# Patient Record
Sex: Female | Born: 2000 | Hispanic: Yes | Smoking: Never smoker
Health system: Southern US, Community
[De-identification: ages and names within clinical notes are randomized; demographics above are authoritative.]

## PROBLEM LIST (undated history)

## (undated) ENCOUNTER — Emergency Department (HOSPITAL_COMMUNITY): Admission: EM | Payer: Medicaid Other | Source: Home / Self Care

## (undated) DIAGNOSIS — G4700 Insomnia, unspecified: Secondary | ICD-10-CM

## (undated) DIAGNOSIS — J45909 Unspecified asthma, uncomplicated: Secondary | ICD-10-CM

## (undated) HISTORY — PX: NO PAST SURGERIES: SHX2092

---
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
## 2022-01-09 LAB — OB RESULTS CONSOLE GBS: GBS: NEGATIVE

## 2022-01-21 ENCOUNTER — Inpatient Hospital Stay (HOSPITAL_COMMUNITY)
Admission: AD | Admit: 2022-01-21 | Discharge: 2022-01-24 | DRG: 807 | Disposition: A | Discharge status: Home / Self Care | Payer: Medicaid Other | Attending: Obstetrics and Gynecology | Admitting: Obstetrics and Gynecology

## 2022-01-21 ENCOUNTER — Encounter (HOSPITAL_COMMUNITY): Payer: Self-pay | Admitting: Obstetrics and Gynecology

## 2022-01-21 DIAGNOSIS — K7689 Other specified diseases of liver: Secondary | ICD-10-CM | POA: Diagnosis present

## 2022-01-21 DIAGNOSIS — K831 Obstruction of bile duct: Secondary | ICD-10-CM | POA: Diagnosis present

## 2022-01-21 DIAGNOSIS — O26643 Intrahepatic cholestasis of pregnancy, third trimester: Principal | ICD-10-CM | POA: Diagnosis present

## 2022-01-21 DIAGNOSIS — E7879 Other disorders of bile acid and cholesterol metabolism: Secondary | ICD-10-CM | POA: Diagnosis present

## 2022-01-21 DIAGNOSIS — Z3490 Encounter for supervision of normal pregnancy, unspecified, unspecified trimester: Principal | ICD-10-CM | POA: Diagnosis present

## 2022-01-21 DIAGNOSIS — Z3A39 39 weeks gestation of pregnancy: Secondary | ICD-10-CM

## 2022-01-21 LAB — COMPREHENSIVE METABOLIC PANEL
ALT: 24 U/L (ref 0–44)
AST: 26 U/L (ref 15–41)
Albumin: 2.8 g/dL — ABNORMAL LOW (ref 3.5–5.0)
Alkaline Phosphatase: 275 U/L — ABNORMAL HIGH (ref 38–126)
Anion gap: 9 (ref 5–15)
BUN: 10 mg/dL (ref 6–20)
CO2: 19 mmol/L — ABNORMAL LOW (ref 22–32)
Calcium: 8.7 mg/dL — ABNORMAL LOW (ref 8.9–10.3)
Chloride: 108 mmol/L (ref 98–111)
Creatinine, Ser: 0.9 mg/dL (ref 0.44–1.00)
GFR, Estimated: >60 mL/min (ref >60)
Glucose, Bld: 84 mg/dL (ref 70–99)
Potassium: 4.1 mmol/L (ref 3.5–5.1)
Sodium: 136 mmol/L (ref 135–145)
Total Bilirubin: 0.2 mg/dL — ABNORMAL LOW (ref 0.3–1.2)
Total Protein: 6.6 g/dL (ref 6.5–8.1)

## 2022-01-21 LAB — TYPE AND SCREEN
ABO/RH(D): O POS
Antibody Screen: NEGATIVE

## 2022-01-21 LAB — CBC
HCT: 35.9 % — ABNORMAL LOW (ref 36.0–46.0)
Hemoglobin: 11.7 g/dL — ABNORMAL LOW (ref 12.0–15.0)
MCH: 25.5 pg — ABNORMAL LOW (ref 26.0–34.0)
MCHC: 32.6 g/dL (ref 30.0–36.0)
MCV: 78.4 fL — ABNORMAL LOW (ref 80.0–100.0)
Platelets: 244 10*3/uL (ref 150–400)
RBC: 4.58 MIL/uL (ref 3.87–5.11)
RDW: 15.4 % (ref 11.5–15.5)
WBC: 8.5 10*3/uL (ref 4.0–10.5)
nRBC: 0 % (ref 0.0–0.2)

## 2022-01-21 MED ORDER — FENTANYL CITRATE (PF) 100 MCG/2ML IJ SOLN
50.0000 ug | INTRAMUSCULAR | Status: DC | PRN
  Administered 2022-01-21 – 2022-01-22 (×2): 50 ug via INTRAVENOUS
  Filled 2022-01-21: qty 2

## 2022-01-21 MED ORDER — MISOPROSTOL 25 MCG QUARTER TABLET
25.0000 ug | ORAL_TABLET | ORAL | Status: DC
  Administered 2022-01-21 (×2): 25 ug via VAGINAL
  Filled 2022-01-21 (×2): qty 1

## 2022-01-21 MED ORDER — OXYTOCIN BOLUS FROM INFUSION
333.0000 mL | Freq: Once | INTRAVENOUS | Status: AC
  Administered 2022-01-22: 333 mL via INTRAVENOUS

## 2022-01-21 MED ORDER — OXYTOCIN-SODIUM CHLORIDE 30-0.9 UT/500ML-% IV SOLN
2.5000 [IU]/h | INTRAVENOUS | Status: DC
  Filled 2022-01-21: qty 500

## 2022-01-21 MED ORDER — SOD CITRATE-CITRIC ACID 500-334 MG/5ML PO SOLN: 30.0000 mL | ORAL | Status: DC | PRN

## 2022-01-21 MED ORDER — OXYCODONE-ACETAMINOPHEN 5-325 MG PO TABS: 2.0000 | ORAL_TABLET | ORAL | Status: DC | PRN

## 2022-01-21 MED ORDER — LIDOCAINE HCL (PF) 1 % IJ SOLN
30.0000 mL | INTRAMUSCULAR | Status: AC | PRN
  Administered 2022-01-22: 30 mL via SUBCUTANEOUS
  Filled 2022-01-21: qty 30

## 2022-01-21 MED ORDER — OXYCODONE-ACETAMINOPHEN 5-325 MG PO TABS: 1.0000 | ORAL_TABLET | ORAL | Status: DC | PRN

## 2022-01-21 MED ORDER — TERBUTALINE SULFATE 1 MG/ML IJ SOLN
0.2500 mg | Freq: Once | INTRAMUSCULAR | Status: AC | PRN
  Administered 2022-01-22: 0.25 mg via SUBCUTANEOUS

## 2022-01-21 MED ORDER — TERBUTALINE SULFATE 1 MG/ML IJ SOLN
0.2500 mg | Freq: Once | INTRAMUSCULAR | Status: DC | PRN
  Filled 2022-01-21: qty 1

## 2022-01-21 MED ORDER — ONDANSETRON HCL 4 MG/2ML IJ SOLN
4.0000 mg | Freq: Four times a day (QID) | INTRAMUSCULAR | Status: DC | PRN
  Administered 2022-01-22: 4 mg via INTRAVENOUS
  Filled 2022-01-21: qty 2

## 2022-01-21 MED ORDER — OXYTOCIN-SODIUM CHLORIDE 30-0.9 UT/500ML-% IV SOLN: 1.0000 m[IU]/min | INTRAVENOUS | Status: DC

## 2022-01-21 MED ORDER — LACTATED RINGERS IV SOLN: INTRAVENOUS | Status: DC

## 2022-01-21 MED ORDER — LACTATED RINGERS IV SOLN
500.0000 mL | INTRAVENOUS | Status: DC | PRN
  Administered 2022-01-21: 500 mL via INTRAVENOUS

## 2022-01-21 MED ORDER — ACETAMINOPHEN 325 MG PO TABS: 650.0000 mg | ORAL_TABLET | ORAL | Status: DC | PRN

## 2022-01-21 NOTE — H&P (Signed)
Alejandra Phillips is a 21 y.o. female presenting for IOL for cholestasis.  21 yo G1P0 @ 38+6 presents for IOL for cholestasis. The patient was seen in the office for her routine prenatal visit yesterday and c/o systemic itching. Bile acids were drawn and returned mildly elevated at 11. The decision was made to proceed with IOL for cholestasis. Her pregnancy has been otherwise uncomplicated except for teen pregnancy OB History     Gravida  1   Para      Term      Preterm      AB      Living         SAB      IAB      Ectopic      Multiple      Live Births             Past Medical History:  Diagnosis Date   Asthma    only has rescue inhaler-   Insomnia 07/21/2015   Past Surgical History:  Procedure Laterality Date   NO PAST SURGERIES     Family History: family history is not on file. Social History:  reports that she has never smoked. She has never used smokeless tobacco. She reports that she does not drink alcohol and does not use drugs.     Maternal Diabetes: No Genetic Screening: Normal Maternal Ultrasounds/Referrals: Normal Fetal Ultrasounds or other Referrals:  None Maternal Substance Abuse:  No Significant Maternal Medications:  None Significant Maternal Lab Results:  Group B Strep negative Number of Prenatal Visits:greater than 3 verified prenatal visits Other Comments:  None  Review of Systems History Dilation: 2 Effacement (%): 70 Station: -3 Exam by:: Cinda Quest, RN Blood pressure 126/87, pulse 64, temperature 98.5 F (36.9 C), temperature source Oral, resp. rate 17, height 5\' 4"  (1.626 m), weight 99.3 kg, last menstrual period 04/24/2021. Exam Physical Exam   AOx3, NAD Abd soft, NT FHR 150 reactive, cat 1 tracing Cvx 2/70/-3 Toco irreg  Prenatal labs: ABO, Rh: --/--/O POS (10/02 1500) Antibody: NEG (10/02 1500) Rubella: Immune (03/09 0000) RPR: Nonreactive (03/09 0000)  HBsAg: Negative (03/09 0000)  HIV: Non-reactive  (03/09 0000)  GBS: Negative/-- (09/20 0000)   Results for orders placed or performed during the hospital encounter of 01/21/22 (from the past 24 hour(s))  CBC     Status: Abnormal   Collection Time: 01/21/22  3:00 PM  Result Value Ref Range   WBC 8.5 4.0 - 10.5 K/uL   RBC 4.58 3.87 - 5.11 MIL/uL   Hemoglobin 11.7 (L) 12.0 - 15.0 g/dL   HCT 03/23/22 (L) 59.5 - 63.8 %   MCV 78.4 (L) 80.0 - 100.0 fL   MCH 25.5 (L) 26.0 - 34.0 pg   MCHC 32.6 30.0 - 36.0 g/dL   RDW 75.6 43.3 - 29.5 %   Platelets 244 150 - 400 K/uL   nRBC 0.0 0.0 - 0.2 %  Type and screen [facility]     Status: None   Collection Time: 01/21/22  3:00 PM  Result Value Ref Range   ABO/RH(D) O POS    Antibody Screen NEG    Sample Expiration      01/24/2022,2359 Performed at [facility], [address], [place], [place]   Comprehensive metabolic panel     Status: Abnormal   Collection Time: 01/21/22  3:00 PM  Result Value Ref Range   Sodium 136 135 - 145 mmol/L  Potassium 4.1 3.5 - 5.1 mmol/L   Chloride 108 98 - 111 mmol/L   CO2 19 (L) 22 - 32 mmol/L   Glucose, Bld 84 70 - 99 mg/dL   BUN 10 6 - 20 mg/dL   Creatinine, Ser 0.90 0.44 - 1.00 mg/dL   Calcium 8.7 (L) 8.9 - 10.3 mg/dL   Total Protein 6.6 6.5 - 8.1 g/dL   Albumin 2.8 (L) 3.5 - 5.0 g/dL   AST 26 15 - 41 U/L   ALT 24 0 - 44 U/L   Alkaline Phosphatase 275 (H) 38 - 126 U/L   Total Bilirubin 0.2 (L) 0.3 - 1.2 mg/dL   GFR, Estimated >60 >60 mL/min   Anion gap 9 5 - 15     Assessment/Plan: 1) Admit 2) Epidural on request 3) cytotec 110mcg Q4 per vagina, then pitocin   Vanessa Kick 01/21/2022, 11:06 PM

## 2022-01-21 NOTE — Progress Notes (Signed)
Discussed nitrous oxide and IV pain medication as methods of pain interventions; patient would like to consider options but in the meantime, would like to stick with heating pad as pain intervention. Will reassess in 30 minutes or sooner if patient calls out sooner. VD RN

## 2022-01-22 ENCOUNTER — Encounter (HOSPITAL_COMMUNITY): Payer: Self-pay | Admitting: Obstetrics and Gynecology

## 2022-01-22 LAB — CBC
HCT: 29.8 % — ABNORMAL LOW (ref 36.0–46.0)
Hemoglobin: 9.7 g/dL — ABNORMAL LOW (ref 12.0–15.0)
MCH: 25.3 pg — ABNORMAL LOW (ref 26.0–34.0)
MCHC: 32.6 g/dL (ref 30.0–36.0)
MCV: 77.8 fL — ABNORMAL LOW (ref 80.0–100.0)
Platelets: 222 10*3/uL (ref 150–400)
RBC: 3.83 MIL/uL — ABNORMAL LOW (ref 3.87–5.11)
RDW: 15.3 % (ref 11.5–15.5)
WBC: 15.6 10*3/uL — ABNORMAL HIGH (ref 4.0–10.5)
nRBC: 0 % (ref 0.0–0.2)

## 2022-01-22 LAB — RPR: RPR Ser Ql: NONREACTIVE

## 2022-01-22 MED ORDER — IBUPROFEN 600 MG PO TABS
600.0000 mg | ORAL_TABLET | Freq: Four times a day (QID) | ORAL | Status: DC
  Administered 2022-01-22 – 2022-01-24 (×9): 600 mg via ORAL
  Filled 2022-01-22 (×10): qty 1

## 2022-01-22 MED ORDER — METHYLERGONOVINE MALEATE 0.2 MG/ML IJ SOLN: 0.2000 mg | INTRAMUSCULAR | Status: DC | PRN

## 2022-01-22 MED ORDER — LACTATED RINGERS IV SOLN: INTRAVENOUS | Status: DC

## 2022-01-22 MED ORDER — ONDANSETRON HCL 4 MG/2ML IJ SOLN: 4.0000 mg | INTRAMUSCULAR | Status: DC | PRN

## 2022-01-22 MED ORDER — OXYCODONE HCL 5 MG PO TABS: 5.0000 mg | ORAL_TABLET | ORAL | Status: DC | PRN

## 2022-01-22 MED ORDER — OXYTOCIN-SODIUM CHLORIDE 30-0.9 UT/500ML-% IV SOLN: 2.5000 [IU]/h | INTRAVENOUS | Status: DC | PRN

## 2022-01-22 MED ORDER — TRANEXAMIC ACID-NACL 1000-0.7 MG/100ML-% IV SOLN
INTRAVENOUS | Status: AC
  Administered 2022-01-22: 1000 mg
  Filled 2022-01-22: qty 100

## 2022-01-22 MED ORDER — ACETAMINOPHEN 325 MG PO TABS: 650.0000 mg | ORAL_TABLET | ORAL | Status: DC | PRN

## 2022-01-22 MED ORDER — COCONUT OIL OIL
1.0000 | TOPICAL_OIL | Status: DC | PRN
  Administered 2022-01-23: 1 via TOPICAL

## 2022-01-22 MED ORDER — DIPHENHYDRAMINE HCL 25 MG PO CAPS: 25.0000 mg | ORAL_CAPSULE | Freq: Four times a day (QID) | ORAL | Status: DC | PRN

## 2022-01-22 MED ORDER — PRENATAL MULTIVITAMIN CH
1.0000 | ORAL_TABLET | Freq: Every day | ORAL | Status: DC
  Administered 2022-01-22 – 2022-01-24 (×3): 1 via ORAL
  Filled 2022-01-22 (×3): qty 1

## 2022-01-22 MED ORDER — METHYLERGONOVINE MALEATE 0.2 MG/ML IJ SOLN
INTRAMUSCULAR | Status: AC
  Administered 2022-01-22: 0.2 mg
  Filled 2022-01-22: qty 1

## 2022-01-22 MED ORDER — ONDANSETRON HCL 4 MG PO TABS: 4.0000 mg | ORAL_TABLET | ORAL | Status: DC | PRN

## 2022-01-22 MED ORDER — TRANEXAMIC ACID-NACL 1000-0.7 MG/100ML-% IV SOLN: 1000.0000 mg | INTRAVENOUS | Status: DC

## 2022-01-22 MED ORDER — BENZOCAINE-MENTHOL 20-0.5 % EX AERO
1.0000 | INHALATION_SPRAY | CUTANEOUS | Status: DC | PRN
  Administered 2022-01-22: 1 via TOPICAL
  Filled 2022-01-22: qty 56

## 2022-01-22 MED ORDER — METHYLERGONOVINE MALEATE 0.2 MG/ML IJ SOLN: 0.2000 mg | Freq: Once | INTRAMUSCULAR | Status: DC

## 2022-01-22 MED ORDER — SIMETHICONE 80 MG PO CHEW: 80.0000 mg | CHEWABLE_TABLET | ORAL | Status: DC | PRN

## 2022-01-22 MED ORDER — TETANUS-DIPHTH-ACELL PERTUSSIS 5-2.5-18.5 LF-MCG/0.5 IM SUSY: 0.5000 mL | PREFILLED_SYRINGE | Freq: Once | INTRAMUSCULAR | Status: DC

## 2022-01-22 MED ORDER — METHYLERGONOVINE MALEATE 0.2 MG PO TABS: 0.2000 mg | ORAL_TABLET | ORAL | Status: DC | PRN

## 2022-01-22 MED ORDER — WITCH HAZEL-GLYCERIN EX PADS: 1.0000 | MEDICATED_PAD | CUTANEOUS | Status: DC | PRN

## 2022-01-22 MED ORDER — OXYCODONE HCL 5 MG PO TABS: 10.0000 mg | ORAL_TABLET | ORAL | Status: DC | PRN

## 2022-01-22 MED ORDER — ZOLPIDEM TARTRATE 5 MG PO TABS: 5.0000 mg | ORAL_TABLET | Freq: Every evening | ORAL | Status: DC | PRN

## 2022-01-22 MED ORDER — SENNOSIDES-DOCUSATE SODIUM 8.6-50 MG PO TABS
2.0000 | ORAL_TABLET | Freq: Every day | ORAL | Status: DC
  Administered 2022-01-23 – 2022-01-24 (×2): 2 via ORAL
  Filled 2022-01-22 (×2): qty 2

## 2022-01-22 MED ORDER — DIBUCAINE (PERIANAL) 1 % EX OINT: 1.0000 | TOPICAL_OINTMENT | CUTANEOUS | Status: DC | PRN

## 2022-01-22 NOTE — Progress Notes (Signed)
Patient ID: Alejandra Phillips, female   DOB: 08/27/2000, 21 y.o.   MRN: [id number]  Called to room emergently by RN staff, pt complete and crowning, community MD on her way. Got to room and fetal head was crowning, then born swiftly LOA. Nuchal cord present. Shoulder and body delivered in usual fashion and nuchal cord easily reduced. Infant with spontaneous cry, stimulated and placed on mom. Dr. Harrington Challenger then at bedside and assumed care of patient.  Gaylan Gerold, CNM, MSN, [place] Certified Nurse Midwife, [facility]

## 2022-01-22 NOTE — Lactation Note (Signed)
This note was copied from a baby's chart. Lactation Consultation Note  Patient Name: Alejandra Phillips [id number]'[id number] Date: 01/22/2022 Reason for consult: Initial assessment;1st time breastfeeding Age:19 hours  P1, Reviewed hand expression with drops expressed. Had mother prepump on L side since she has had difficulty latching on left.  Baby sleepy after recent bath.  Provided mother with shells but she does not have bra at this time. Encouraged calling for help as needed. Feed on demand with cues.  Goal 8-12+ times per day after first 24 hrs.  Place baby STS if not cueing.  Mom made aware of O/P services, breastfeeding support groupsand our phone # for post-discharge questions.    Maternal Data Has patient been taught Hand Expression?: Yes Does the patient have breastfeeding experience prior to this delivery?: No  Feeding Mother's Current Feeding Choice: Breast Milk  Lactation Tools Discussed/Used Tools: Pump Breast pump type: Manual Reason for Pumping: stimulation  Interventions Interventions: Assisted with latch;Skin to skin;Hand express;Education;LC Services brochure  Discharge    Consult Status Consult Status: Follow-up Date: 01/22/22 Follow-up type: In-patient    Vivianne Master [facility] 01/22/2022, 2:53 PM

## 2022-01-22 NOTE — Lactation Note (Addendum)
This note was copied from a baby's chart. Lactation Consultation Note  Patient Name: Alejandra Phillips [id number] Date: 01/22/2022 Reason for consult: Follow-up assessment;Mother's request;Difficult latch (given 24 mm NS earlier today, Per Birth Parent infant last BF at 10 am, infant went 7 hours without feeding today.) Age:19 hours Infant had stool while LC was in room that RN changed.  Infant had 4 stools since delivery. LC did suck training which stimulated infant to start cuing to breastfeed. Birth Parent pre-pumped with hand pump and applied 24 mm NS, infant latched on Birth Parent's left breast using the football hold position, infant sustained latch and BF for 15 minutes, LC removed 24 mm NS and infant BF for additional 5 minutes. Total feeding was 20 minutes.  LC discussed with Birth Parent infant's small tummy size and BF infant according to hunger cues, 8 to 12+ times within 24 hours, that 7 hours is to long to go without feeding infant, Birth Parent can call RN/LC to help with latch assistance if needed. LC reviewed hand expression and Birth Parent knows she can hand express or use DEBP and give infant back any EBM by spoon if infant doesn't latch at the breast. LC discussed 24 mm NS is for short term use and to continue to latch infant with 24 mm NS but after 24 hrs try latch infant without it. Birth Parent was using the DEBP as LC left the room. Birth Parent Plan: 1- Pre-pump with hand pump then apply 24 mm NS, ask RN for latch assistance or help with apply NS when latching infant, continue to BF infant according to cues, 8 to 12+ times within 24 hrs, STS. 2- Do not go past 31/2 hours without BF infant, if infant doesn't latch , Birth Parent knows to hand express or pump and give infant back EBM by spoon. 3-Wear breast shells when not sleep in bra to help evert nipple shaft out more. 4- Continue to use DEBP every 3 hours for 15 minutes on initial setting.  Maternal Data Has  patient been taught Hand Expression?: Yes Does the patient have breastfeeding experience prior to this delivery?: No  Feeding Mother's Current Feeding Choice: Breast Milk  LATCH Score Latch: Grasps breast easily, tongue down, lips flanged, rhythmical sucking.  Audible Swallowing: Spontaneous and intermittent  Type of Nipple: Everted at rest and after stimulation (short shafted)  Comfort (Breast/Nipple): Soft / non-tender  Hold (Positioning): Assistance needed to correctly position infant at breast and maintain latch.  LATCH Score: 9   Lactation Tools Discussed/Used Tools: Nipple [place] [employer];Shells Nipple shield size: 24 Breast pump type: Manual Pump Education: Setup, frequency, and cleaning;Milk Storage Reason for Pumping: stimulation Pumping frequency: Birth Parent will use DEBP evry 3 hours for 15 minutes and give infant back any EBM by spoon.  Interventions Interventions: Assisted with latch;Skin to skin;Pre-pump if needed;Reverse pressure;Breast compression;Adjust position;Support pillows;Position options;Expressed milk;Shells;Education;DEBP;Hand pump  Discharge    Consult Status Consult Status: Follow-up Date: 01/23/22 Follow-up type: In-patient    [name] 01/22/2022, 5:54 PM

## 2022-01-22 NOTE — Progress Notes (Signed)
Post Partum Day 0 Subjective: up ad lib, voiding, tolerating PO, + flatus, and lochia mild. She reports felt slightly lightheaded when first got out of bed; better now.  She is bonding well with baby.   Objective: Blood pressure 111/65, pulse 64, temperature 97.8 F (36.6 C), temperature source Oral, resp. rate 18, height 5\' 4"  (1.626 m), weight 99.3 kg, last menstrual period 04/24/2021, SpO2 100 %, unknown if currently breastfeeding.  Physical Exam:  General: alert, cooperative, and no distress Lochia: appropriate Uterine Fundus: firm Incision: n/a DVT Evaluation: No evidence of DVT seen on physical exam.  Recent Labs    01/21/22 1500 01/22/22 0454  HGB 11.7* 9.7*  HCT 35.9* 29.8*    Assessment/Plan: Breastfeeding Routine pp care Advised pt to call for nurse or to make sure has someone in room with her next few times when  getting in and out of bed   LOS: 1 day   Alejandra Phillips W Khadim Lundberg, DO 01/22/2022, 11:41 AM

## 2022-01-22 NOTE — Progress Notes (Signed)
Patient requested second half (another 9mcg) of fentanyl dose for the hour; explained that patient could not get another dose until an hour from this dose administration. Pt verbalized understanding. RN discussed epidural with patient, especially since patient stated "she didn't feel any" of the fentanyl dose given to her thus far. Patient would like time to consider epidural. RN to reassess with patient at next SVE in about 30 minutes. VD RN

## 2022-01-22 NOTE — Progress Notes (Signed)
Patient given 55mcg of fentanyl; would like to wait and see if second half of full dose needed in about 15 minutes. Will reassess with patient then. VD RN

## 2022-01-22 NOTE — Progress Notes (Signed)
This nurse went over [place] again with mother after this nurse walked in with mother sleeping with baby in bed.

## 2022-01-22 NOTE — Lactation Note (Signed)
This note was copied from a baby's chart. Lactation Consultation Note  Patient Name: Alejandra Phillips [id number] Date: 01/22/2022 Reason for consult: L&D Initial assessment;Primapara;Term Age:19 hours Assisted baby to the breast in cradle position. LC had to hold breast tissue in baby's mouth in order for baby to suckle on breast. Baby unable to maintain latch.  Mom will need pump and shells and possible NS.  Mom has a lot of colostrum. Maybe w/pumping and expressing some colostrum to soften breast tissue baby will be able to maintain latch. Will f/u mom on MBU.  Maternal Data Does the patient have breastfeeding experience prior to this delivery?: No  Feeding    LATCH Score Latch: Repeated attempts needed to sustain latch, nipple held in mouth throughout feeding, stimulation needed to elicit sucking reflex.  Audible Swallowing: None  Type of Nipple: Flat  Comfort (Breast/Nipple): Filling, red/small blisters or bruises, mild/mod discomfort (breast full feeling)  Hold (Positioning): Full assist, staff holds infant at breast  LATCH Score: 3   Lactation Tools Discussed/Used    Interventions Interventions: Adjust position;Assisted with latch;Support pillows;Skin to skin;Breast compression;Hand express  Discharge    Consult Status Consult Status: Follow-up from L&D Date: 01/22/22 Follow-up type: In-patient    Theodoro Kalata 01/22/2022, 2:38 AM

## 2022-01-23 NOTE — Social Work (Addendum)
CSW received consult for hx of major depression disorder.  CSW met with MOB to offer support and complete assessment.    CSW met with MOB at bedside and introduced CSW role. CSW observed MOB up in room consoling the crying infant with FOB present at bedside offering support. MOB presented calm and welcomed CSW visit. CSW offered MOB privacy and FOB left the room to allow privacy. CSW inquired how MOB has felt since giving birth. MOB stated that she feels physically tired but very excited about the baby. MOB shared during the L&D she did not know what to expect, the pain was unbearable, and she did not get much relief from pain medication since she was contracting so fast. CSW acknowledged MOB efforts. CSW inquired about MOB history of major depression disorder. MOB stated, "I did, in middle school." In the eighth grade she was hospitalized and kept for observation. MOB denies any current concerns with depression symptoms and reported none during the pregnancy. CSW inquired about MOB supports. MOB identified FOB, her family, and in-laws as supports. MOB reported she feels comfortable reaching out to her family for support. CSW discussed PPD/PPA. CSW provided education regarding the baby blues period vs. perinatal mood disorders, discussed treatment and gave resources for mental health follow up if concerns arise.  CSW recommended MOB complete a self-evaluation during the postpartum time period using the New Mom Checklist from Postpartum Progress and encouraged MOB to contact a medical professional if symptoms are noted at any time. CSW assessed MOB for safety. MOB denied thoughts of harm to self and others.   MOB reported she has all essential items to care for the infant. CSW provided review of Sudden Infant Death Syndrome (SIDS) precautions. MOB has chosen [facility] for the infant's follow up care. CSW assessed MOB for additional needs. MOB reported no further need.   Per [employer] representative, MOB is  followed by [facility] for community services.   CSW identifies no further need for intervention and no barriers to discharge at this time.   Kathrin Greathouse, MSW, LCSW [facility] [place] Work[facility]  [phone] 01/23/2022  12:18 PM

## 2022-01-23 NOTE — Progress Notes (Signed)
Post Partum Day 1 Subjective: Doing well without complaints. Ambulating, voiding, tolerating PO. Minimal lochia.   Objective: Patient Vitals for the past 24 hrs:  BP Temp Temp src Pulse Resp SpO2  01/23/22 0543 110/65 98 F (36.7 C) Oral 70 16 98 %  01/22/22 2032 106/61 98.1 F (36.7 C) Oral 72 17 98 %  01/22/22 1700 115/72 98.8 F (37.1 C) Oral 76 18 99 %  01/22/22 1309 102/61 98.3 F (36.8 C) Oral 91 16 98 %    Physical Exam:  General: alert, cooperative, and no distress Lochia: appropriate Uterine Fundus: firm DVT Evaluation: No evidence of DVT seen on physical exam.  Recent Labs    01/21/22 1500 01/22/22 0454  WBC 8.5 15.6*  HGB 11.7* 9.7*  HCT 35.9* 29.8*  PLT 244 222    Recent Labs    01/21/22 1500  NA 136  K 4.1  CL 108  BUN 10  CREATININE 0.90  GLUCOSE 84  BILITOT 0.2*  ALT 24  AST 26  ALKPHOS 275*  PROT 6.6  ALBUMIN 2.8*    Recent Labs    01/21/22 1500  CALCIUM 8.7*    No results for input(s): "PROTIME", "APTT", "INR" in the last 72 hours.  No results for input(s): "PROTIME", "APTT", "INR", "FIBRINOGEN" in the last 72 hours. Assessment/Plan:  Alejandra Phillips 21 y.o. [name] PPD#1 sp SVD 1. PPC: routine PP care 2. Rh pos 3. Dispo: plan discharge home tomorrow   LOS: 2 days   Rowland Lathe 01/23/2022, 11:30 AM

## 2022-01-23 NOTE — Lactation Note (Signed)
This note was copied from a baby's chart. Lactation Consultation Note  Patient Name: Alejandra Phillips [id number] Date: 01/23/2022 Reason for consult: Follow-up assessment;1st time breastfeeding;Infant weight loss (-6% weight loss) Age:19 hours Infant had 3 voids and 4 stools since birth. Birth Parent current feeding choice is breast and formula feeding. Per Birth Parent, infant recently BF for 35 minutes and then again 7 minutes recently. Per Birth Parent she has been latching infant without 24 mm NS today but has abrasions on her right breast only, infant is latching better on left breast. LC discussed to  re-apply NS to right breast only and continue to latch infant on the left breast without NS. Birth Parent knows to ask for further latch assistance if needed. Birth Parent does want to supplement infant, infant was supplemented with 13 mls of formula using purple and clear slow flow bottle nipple.  Per Birth Parent she doesn't want to use the DEBP,  she prefers hand pump and has used hand pump, pumping  6 mls of colostrum that had to be discarded due to being out  for 6 hrs at room temperature. LC discussed with Birth Parent to offer infant any EBM that she pumped after latching infant at the breast and reminded Birth Parent EBM is safe for 4 hrs at room temperature whereas formula only 1 hour. Birth Parent feeding plan: 1- Birth Parent will continue to BF infant according to hunger cues, on demand, 8 to 12 times within 24 hrs, STS. 2- Birth Parent will use 24 mm NS on right breast only when latching infant allows breast to heal and recover and knows to ask for latch assistance if needed. 3- Birth Parent plans to latch infant on both breast during a feeding and if infant is still cuing she will continue to supplement with formula, Birth Parent has BF supplemental sheet. 4- Birth Parent prefers to use hand pump and will pump and give infant back any EBM first and /then formula.   Maternal Data    Feeding Mother's Current Feeding Choice: Breast Milk and Formula Nipple Type: Extra Slow Flow  LATCH Score                    Lactation Tools Discussed/Used    Interventions Interventions: Skin to skin;Pace feeding;Education;Hand pump;DEBP  Discharge    Consult Status Consult Status: Follow-up Date: 01/24/22 Follow-up type: In-patient    [name] 01/23/2022, 9:32 PM

## 2022-01-24 ENCOUNTER — Other Ambulatory Visit: Payer: Self-pay

## 2022-01-24 MED ORDER — IBUPROFEN 600 MG PO TABS: 600.0000 mg | ORAL_TABLET | Freq: Four times a day (QID) | ORAL | 0 refills | Status: AC

## 2022-01-24 NOTE — Discharge Instructions (Signed)
As per discharge pamphlet °

## 2022-01-24 NOTE — Progress Notes (Signed)
PPD #2 Doing well, no problems Afeb, VSS D/c home 

## 2022-01-24 NOTE — Discharge Summary (Signed)
Postpartum Discharge Summary      Patient Name: Alejandra Phillips DOB: 2000/06/27 MRN: [id number]  Date of admission: 01/21/2022 Delivery date:01/22/2022  Delivering provider: Gaylan Gerold R  Date of discharge: 01/24/2022  Admitting diagnosis: Encounter for induction of labor [Z34.90] Preterm delivery without spontaneous labor [O60.10X0] Intrauterine pregnancy: [redacted]w[redacted]d     Secondary diagnosis:  Principal Problem:   Encounter for induction of labor Active Problems:   Preterm delivery without spontaneous labor    Discharge diagnosis: Term Pregnancy Delivered and cholestasis of pregnancy                                                Hospital course: Induction of Labor With Vaginal Delivery   21 y.o. yo [name] at [redacted]w[redacted]d was admitted to the hospital 01/21/2022 for induction of labor.  Indication for induction: Cholestasis of pregnancy.  Patient had an uncomplicated labor course as follows: Membrane Rupture Time/Date:  ,   Delivery Method:Vaginal, Spontaneous  Episiotomy: None  Lacerations:  2nd degree  Details of delivery can be found in separate delivery note.  Patient had a routine postpartum course. Patient is discharged home 01/24/22.  Newborn Data: Birth date:01/22/2022  Birth time:1:40 AM  Gender:Female  Living status:Living  Apgars:8 ,9  Weight:3240 g    Physical exam  Vitals:   01/23/22 0543 01/23/22 1450 01/23/22 2103 01/24/22 0545  BP: 110/65 108/70 102/64 115/86  Pulse: 70 72 90 75  Resp: 16 18 16 18   Temp: 98 F (36.7 C) 98 F (36.7 C) 98.1 F (36.7 C) 98.2 F (36.8 C)  TempSrc: Oral Axillary Oral Oral  SpO2: 98%  99% 100%  Weight:      Height:       General: alert Lochia: appropriate Uterine Fundus: firm  Labs: Lab Results  Component Value Date   WBC 15.6 (H) 01/22/2022   HGB 9.7 (L) 01/22/2022   HCT 29.8 (L) 01/22/2022   MCV 77.8 (L) 01/22/2022   PLT 222 01/22/2022      Latest Ref Rng & Units 01/21/2022    3:00 PM  CMP  Glucose  70 - 99 mg/dL 84   BUN 6 - 20 mg/dL 10   Creatinine 0.44 - 1.00 mg/dL 0.90   Sodium 135 - 145 mmol/L 136   Potassium 3.5 - 5.1 mmol/L 4.1   Chloride 98 - 111 mmol/L 108   CO2 22 - 32 mmol/L 19   Calcium 8.9 - 10.3 mg/dL 8.7   Total Protein 6.5 - 8.1 g/dL 6.6   Total Bilirubin 0.3 - 1.2 mg/dL 0.2   Alkaline Phos 38 - 126 U/L 275   AST 15 - 41 U/L 26   ALT 0 - 44 U/L 24    Edinburgh Score:    01/23/2022   10:25 AM  Edinburgh Postnatal Depression Scale Screening Tool  I have been able to laugh and see the funny side of things. 0  I have looked forward with enjoyment to things. 0  I have blamed myself unnecessarily when things went wrong. 0  I have been anxious or worried for no good reason. 0  I have felt scared or panicky for no good reason. 0  Things have been getting on top of me. 0  I have been so unhappy that I have had difficulty sleeping. 0  I have felt sad or  miserable. 0  I have been so unhappy that I have been crying. 0  The thought of harming myself has occurred to me. 0  Edinburgh Postnatal Depression Scale Total 0      After visit meds:  Allergies as of 01/24/2022       Reactions   Other Swelling   Pineapple        Medication List     TAKE these medications    ferrous sulfate 325 (65 FE) MG tablet Take 325 mg by mouth daily with breakfast.   ibuprofen 600 MG tablet Commonly known as: ADVIL Take 1 tablet (600 mg total) by mouth every 6 (six) hours.   PRENATAL VITAMIN PO Take 1 tablet by mouth daily.         Discharge home in stable condition Infant Feeding: Breast Infant Disposition:home with mother Discharge instruction: per After Visit Summary and Postpartum booklet. Activity: Advance as tolerated. Pelvic rest for 6 weeks.  Diet: routine diet Postpartum Appointment:6 weeks Follow up Visit:  Follow-up Information     [name], [name], MD. Schedule an appointment as soon as possible for a visit in 6 week(s).   Specialty: Obstetrics  and Gynecology Contact information: [address] [place] [address] [phone]                     01/24/2022 [name], MD

## 2022-01-24 NOTE — Lactation Note (Signed)
This note was copied from a baby's chart. Lactation Consultation Note  Patient Name: Alejandra Phillips [id number] Date: 01/24/2022 Reason for consult: Follow-up assessment Age:19 hours  P1, Mother hand expressed good flow of transitional breastmilk.  Infants stools are green per mother. Observed latch.  Baby eager.  Mother has been breastfeed on one breast per session.  Suggest offering second breast after burping. This will increase volume for baby. Mother started supplementing with formula. Encouraged breastfeeding first to establish her milk supply. Mother has coconut oil for tender nipples. Mother has DEBP at home and knows she can pump to supplement baby is desired. Reviewed engorgement care and monitoring voids/stools.  Feeding Mother's Current Feeding Choice: Breast Milk and Formula  LATCH Score Latch: Grasps breast easily, tongue down, lips flanged, rhythmical sucking.  Audible Swallowing: A few with stimulation  Type of Nipple: Everted at rest and after stimulation  Comfort (Breast/Nipple): Filling, red/small blisters or bruises, mild/mod discomfort  Hold (Positioning): No assistance needed to correctly position infant at breast.  LATCH Score: 8   Lactation Tools Discussed/Used    Interventions Interventions: Breast feeding basics reviewed;Hand express;Education  Discharge Discharge Education: Engorgement and breast care;Warning signs for feeding baby  Consult Status Consult Status: Complete Date: 01/24/22    Vivianne Master [facility] 01/24/2022, 11:17 AM

## 2022-01-31 ENCOUNTER — Telehealth (HOSPITAL_COMMUNITY): Payer: Self-pay | Admitting: *Deleted

## 2022-01-31 NOTE — Telephone Encounter (Signed)
Hospital Discharge Follow-Up Call:  Patient reports that she is well and has no concerns about her healing process.  EPDS today was 0 and she endorses this accurately reflects that she is doing well emotionally.  Patient says that baby is well and she has no concerns about baby's health.  She reports that baby sleeps in a crib.  Reviewed ABCs of Safe Sleep. 

## 2022-02-01 ENCOUNTER — Inpatient Hospital Stay (HOSPITAL_COMMUNITY): Payer: Medicaid Other

## 2022-12-12 ENCOUNTER — Ambulatory Visit: Payer: Medicaid Other | Admitting: Nurse Practitioner

## 2023-01-18 ENCOUNTER — Other Ambulatory Visit: Payer: Self-pay

## 2023-01-18 ENCOUNTER — Encounter (HOSPITAL_COMMUNITY): Payer: Self-pay

## 2023-01-18 ENCOUNTER — Emergency Department (HOSPITAL_COMMUNITY)
Admission: EM | Admit: 2023-01-18 | Discharge: 2023-01-18 | Disposition: A | Discharge status: Home / Self Care | Payer: Medicaid Other | Attending: Emergency Medicine | Admitting: Emergency Medicine

## 2023-01-18 DIAGNOSIS — N94819 Vulvodynia, unspecified: Secondary | ICD-10-CM | POA: Diagnosis present

## 2023-01-18 DIAGNOSIS — N766 Ulceration of vulva: Secondary | ICD-10-CM | POA: Insufficient documentation

## 2023-01-18 DIAGNOSIS — J45909 Unspecified asthma, uncomplicated: Secondary | ICD-10-CM | POA: Insufficient documentation

## 2023-01-18 DIAGNOSIS — N9089 Other specified noninflammatory disorders of vulva and perineum: Secondary | ICD-10-CM

## 2023-01-18 LAB — URINALYSIS, ROUTINE W REFLEX MICROSCOPIC
Bilirubin Urine: NEGATIVE
Glucose, UA: NEGATIVE mg/dL
Ketones, ur: NEGATIVE mg/dL
Nitrite: NEGATIVE
Protein, ur: 30 mg/dL — AB
Specific Gravity, Urine: 1.02 (ref 1.005–1.030)
WBC, UA: >50 WBC/hpf (ref 0–5)
pH: 5 (ref 5.0–8.0)

## 2023-01-18 LAB — PREGNANCY, URINE: Preg Test, Ur: NEGATIVE

## 2023-01-18 MED ORDER — LIDOCAINE HCL URETHRAL/MUCOSAL 2 % EX GEL
1.0000 | Freq: Once | CUTANEOUS | Status: AC
  Administered 2023-01-18: 1 via TOPICAL
  Filled 2023-01-18: qty 11

## 2023-01-18 MED ORDER — VALACYCLOVIR HCL 1 G PO TABS: 1000.0000 mg | ORAL_TABLET | Freq: Two times a day (BID) | ORAL | 0 refills | Status: AC

## 2023-01-18 MED ORDER — LIDOCAINE 5 % EX OINT: 1.0000 | TOPICAL_OINTMENT | CUTANEOUS | 0 refills | Status: AC | PRN

## 2023-01-18 NOTE — ED Triage Notes (Signed)
Pt came in via POV d/t swollen/burning labia d/t shaving her peri area then having intercourse. Denies vaginal pain but does burn when she urinates. A/Ox4, rates 9/10.

## 2023-01-18 NOTE — Discharge Instructions (Addendum)
As discussed, your pain is most likely due to a herpes (or HSV) outbreak.  Your results come back in several days.  You will receive a call from the hospital if your results are positive positive.  You can also check MyChart for your results.  You can apply lidocaine ointment as needed for pain.  Please take valacyclovir twice a day for the next 7 days.  Please complete entirety of the prescription.  Follow-up with your PCP in the next 3 to 5 days for reevaluation of your symptoms.  If you do not have one, there is a phone number in the discharge paperwork you can call that we will help you get established with one.  Get help right away if: You have symptoms of viral meningitis. This is rare but may happen if the virus spreads to the brain. Symptoms may include: Severe headache or stiff neck. Muscle aches. Nausea and vomiting. Sensitivity to light.

## 2023-01-18 NOTE — ED Provider Notes (Signed)
Alejandra Phillips EMERGENCY DEPARTMENT AT [facility] Provider Note   CSN: [id number] Arrival date & time: 01/18/23  1228     History  Chief Complaint  Patient presents with   Vaginal Pain   Dysuria    Alejandra Phillips is a 22 y.o. female with a history of asthma and insomnia who presents the ED today for labial pain. Patient reports that several days ago, she went to the bathroom after having intercourse with her partner and reported a burning sensation when the urine touched her skin. Denies vaginal pain, discharge, bleeding, or difficulty urinating. Since then, she reports pain and a burning sensation to her labia bilaterally with some swelling to the left.  She has never experienced any like this before.  No additional complaints or concerns at this time.    Home Medications Prior to Admission medications   Medication Sig Start Date End Date Taking? Authorizing Provider  lidocaine (XYLOCAINE) 5 % ointment Apply 1 Application topically as needed. 01/18/23  Yes Maxwell Marion, PA-C  valACYclovir (VALTREX) 1000 MG tablet Take 1 tablet (1,000 mg total) by mouth 2 (two) times daily for 7 days. 01/18/23 01/25/23 Yes Maxwell Marion, PA-C  ferrous sulfate 325 (65 FE) MG tablet Take 325 mg by mouth daily with breakfast.    [provider]  ibuprofen (ADVIL) 600 MG tablet Take 1 tablet (600 mg total) by mouth every 6 (six) hours. 01/24/22   Meisinger, Tawanna Cooler, MD  Prenatal Vit-Fe Fumarate-FA (PRENATAL VITAMIN PO) Take 1 tablet by mouth daily.    [provider]      Allergies    Other    Review of Systems   Review of Systems  Genitourinary:  Positive for genital sores.  All other systems reviewed and are negative.   Physical Exam Updated Vital Signs BP 108/60   Pulse (!) 101   Temp 98.6 F (37 C) (Oral)   Resp 16   Ht 5\' 4"  (1.626 m)   Wt 87.5 kg   SpO2 99%   BMI 33.13 kg/m  Physical Exam Vitals and nursing note reviewed. Exam conducted with a chaperone  present Marylene Land, RN).  Constitutional:      Appearance: Normal appearance.  HENT:     Head: Normocephalic and atraumatic.     Mouth/Throat:     Mouth: Mucous membranes are moist.  Eyes:     Conjunctiva/sclera: Conjunctivae normal.     Pupils: Pupils are equal, round, and reactive to light.  Cardiovascular:     Rate and Rhythm: Normal rate and regular rhythm.     Pulses: Normal pulses.     Heart sounds: Normal heart sounds.  Pulmonary:     Effort: Pulmonary effort is normal.     Breath sounds: Normal breath sounds.  Abdominal:     Palpations: Abdomen is soft.     Tenderness: There is no abdominal tenderness.  Genitourinary:    General: Normal vulva.     Rectum: Normal.     Comments: Ulcerations present on bilateral labia, with some swelling present at the left labia Skin:    General: Skin is warm and dry.     Findings: No rash.  Neurological:     General: No focal deficit present.     Mental Status: She is alert.  Psychiatric:        Mood and Affect: Mood normal.        Behavior: Behavior normal.     ED Results / Procedures / Treatments  Labs (all labs ordered are listed, but only abnormal results are displayed) Labs Reviewed  URINALYSIS, ROUTINE W REFLEX MICROSCOPIC - Abnormal; Notable for the following components:      Result Value   APPearance CLOUDY (*)    Hgb urine dipstick SMALL (*)    Protein, ur 30 (*)    Leukocytes,Ua LARGE (*)    Bacteria, UA FEW (*)    All other components within normal limits  HSV CULTURE AND TYPING  PREGNANCY, URINE    EKG None  Radiology No results found.  Procedures Procedures: not indicated.   Medications Ordered in ED Medications  lidocaine (XYLOCAINE) 2 % jelly 1 Application (1 Application Topical Given 01/18/23 1600)    ED Course/ Medical Decision Making/ A&P                                 Medical Decision Making Amount and/or Complexity of Data Reviewed Labs: ordered.  Risk Prescription drug  management.   This patient presents to the ED for concern of labial pain, this involves an extensive number of treatment options, and is a complaint that carries with it a high risk of complications and morbidity.   Differential diagnosis includes: cyst, abscess, UTI, STD, etc.   Comorbidities  See HPI above   Additional History  Additional history obtained from patient's previous records.   Lab Tests  I ordered and personally interpreted labs.  The pertinent results include:   Urine shows few bacteria and large leukocytes.  Patient denies dysuria so will not treat for UTI. Negative pregnancy test HSV culture pending.   Problem List / ED Course / Critical Interventions / Medication Management  Genital sores and pain I ordered medications including: Lidocaine jelly for pain  Reevaluation of the patient after these medicines showed that the patient improved I have reviewed the patients home medicines and have made adjustments as needed   Social Determinants of Health  Social connection   Test / Admission - Considered  Discussed findings with patient.  All questions were answered. She is hemodynamically stable and safe for discharge home. Prescription for prophylactic valacyclovir and lidocaine ointment prescribed. Return precautions provided.       Final Clinical Impression(s) / ED Diagnoses Final diagnoses:  Labial lesion    Rx / DC Orders ED Discharge Orders          Ordered    valACYclovir (VALTREX) 1000 MG tablet  2 times daily        01/18/23 1548    lidocaine (XYLOCAINE) 5 % ointment  As needed        01/18/23 1551              Maxwell Marion, PA-C 01/18/23 2243    Linwood Dibbles, MD 01/19/23 [id number]

## 2023-01-21 LAB — HSV CULTURE AND TYPING

## 2023-04-20 ENCOUNTER — Emergency Department (HOSPITAL_COMMUNITY): Payer: Medicaid Other

## 2023-04-20 ENCOUNTER — Other Ambulatory Visit: Payer: Self-pay

## 2023-04-20 ENCOUNTER — Encounter (HOSPITAL_COMMUNITY): Payer: Self-pay | Admitting: Emergency Medicine

## 2023-04-20 ENCOUNTER — Emergency Department (HOSPITAL_COMMUNITY)
Admission: EM | Admit: 2023-04-20 | Discharge: 2023-04-21 | Discharge status: Against Medical Advice | Payer: Medicaid Other | Attending: Emergency Medicine | Admitting: Emergency Medicine

## 2023-04-20 DIAGNOSIS — Z5321 Procedure and treatment not carried out due to patient leaving prior to being seen by health care provider: Secondary | ICD-10-CM | POA: Diagnosis not present

## 2023-04-20 DIAGNOSIS — Z20822 Contact with and (suspected) exposure to covid-19: Secondary | ICD-10-CM | POA: Insufficient documentation

## 2023-04-20 DIAGNOSIS — J101 Influenza due to other identified influenza virus with other respiratory manifestations: Secondary | ICD-10-CM | POA: Insufficient documentation

## 2023-04-20 DIAGNOSIS — R531 Weakness: Secondary | ICD-10-CM | POA: Diagnosis present

## 2023-04-20 LAB — RESP PANEL BY RT-PCR (RSV, FLU A&B, COVID)  RVPGX2
Influenza A by PCR: POSITIVE — AB
Influenza B by PCR: NEGATIVE
Resp Syncytial Virus by PCR: NEGATIVE
SARS Coronavirus 2 by RT PCR: NEGATIVE

## 2023-04-20 MED ORDER — ACETAMINOPHEN 500 MG PO TABS
1000.0000 mg | ORAL_TABLET | Freq: Once | ORAL | Status: AC
  Administered 2023-04-20: 1000 mg via ORAL
  Filled 2023-04-20: qty 2

## 2023-04-20 MED ORDER — ACETAMINOPHEN 500 MG PO TABS
1000.0000 mg | ORAL_TABLET | Freq: Once | ORAL | Status: AC
  Administered 2023-04-21: 1000 mg via ORAL
  Filled 2023-04-20: qty 2

## 2023-04-20 MED ORDER — ACETAMINOPHEN 325 MG PO TABS: 325.0000 mg | ORAL_TABLET | Freq: Once | ORAL | Status: DC

## 2023-04-20 NOTE — ED Triage Notes (Signed)
Body aches, fever, and cough since yesterday. No n/v/d or shob.  Last tylenol about noon today.

## 2023-04-20 NOTE — ED Provider Triage Note (Cosign Needed)
Emergency Medicine Provider Triage Evaluation Note  Alejandra Phillips , a 22 y.o. female  was evaluated in triage.  Pt complains of generalized weakness, chills, and cough.  Review of Systems  Positive: cough Negative: Abdominal pain  Physical Exam  BP 99/73 (BP Location: Right Arm)   Pulse (!) 145   Temp (!) 102.9 F (39.4 C) (Oral)   Resp (!) 22   SpO2 98%  Gen:   Awake, no distress   Resp:  Normal effort  MSK:   Moves extremities without difficulty  Other:    Medical Decision Making  Medically screening exam initiated at 5:19 PM.  Appropriate orders placed.  Alejandra Phillips was informed that the remainder of the evaluation will be completed by another provider, this initial triage assessment does not replace that evaluation, and the importance of remaining in the ED until their evaluation is complete.    Pete Pelt, [place] 04/20/23 1719

## 2023-04-21 NOTE — ED Notes (Signed)
Pt decided to leave while waiting for a room.
